# Patient Record
Sex: Male | Born: 1980 | Race: White | Hispanic: No | Marital: Single | Smoking: Current every day smoker
Health system: Southern US, Community
[De-identification: ages and names within clinical notes are randomized; demographics above are authoritative.]

---
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
## 2005-02-03 ENCOUNTER — Emergency Department: Payer: Self-pay | Admitting: General Practice

## 2006-08-07 ENCOUNTER — Emergency Department: Payer: Self-pay | Admitting: Emergency Medicine

## 2006-08-29 ENCOUNTER — Emergency Department: Payer: Self-pay | Admitting: Emergency Medicine

## 2006-12-11 ENCOUNTER — Emergency Department: Payer: Self-pay | Admitting: Emergency Medicine

## 2007-09-05 ENCOUNTER — Emergency Department: Payer: Self-pay | Admitting: Emergency Medicine

## 2008-09-27 ENCOUNTER — Emergency Department: Payer: Self-pay | Admitting: Emergency Medicine

## 2010-11-22 ENCOUNTER — Emergency Department: Payer: Self-pay | Admitting: Unknown Physician Specialty

## 2012-11-24 ENCOUNTER — Emergency Department: Payer: Self-pay | Admitting: Emergency Medicine

## 2012-11-24 LAB — COMPREHENSIVE METABOLIC PANEL
Albumin: 4.1 g/dL (ref 3.4–5.0)
Alkaline Phosphatase: 95 U/L (ref 50–136)
BUN: 9 mg/dL (ref 7–18)
Bilirubin,Total: 0.4 mg/dL (ref 0.2–1.0)
Calcium, Total: 8.8 mg/dL (ref 8.5–10.1)
Chloride: 107 mmol/L (ref 98–107)
Co2: 26 mmol/L (ref 21–32)
Creatinine: 1.26 mg/dL (ref 0.60–1.30)
EGFR (Non-African Amer.): >60
Glucose: 148 mg/dL — ABNORMAL HIGH (ref 65–99)
Osmolality: 281 (ref 275–301)
Potassium: 3.9 mmol/L (ref 3.5–5.1)
SGPT (ALT): 29 U/L (ref 12–78)
Sodium: 140 mmol/L (ref 136–145)
Total Protein: 8 g/dL (ref 6.4–8.2)

## 2012-11-24 LAB — ETHANOL
Ethanol %: 0.177 % — ABNORMAL HIGH (ref 0.000–0.080)
Ethanol: 177 mg/dL

## 2012-11-24 LAB — DRUG SCREEN, URINE
Barbiturates, Ur Screen: NEGATIVE (ref ?–200)
Benzodiazepine, Ur Scrn: NEGATIVE (ref ?–200)
Cannabinoid 50 Ng, Ur ~~LOC~~: POSITIVE (ref ?–50)
Cocaine Metabolite,Ur ~~LOC~~: POSITIVE (ref ?–300)
Methadone, Ur Screen: NEGATIVE (ref ?–300)
Opiate, Ur Screen: NEGATIVE (ref ?–300)
Phencyclidine (PCP) Ur S: NEGATIVE (ref ?–25)

## 2012-11-24 LAB — URINALYSIS, COMPLETE
Bacteria: NONE SEEN
Leukocyte Esterase: NEGATIVE
Nitrite: NEGATIVE
Ph: 5 (ref 4.5–8.0)
Protein: NEGATIVE

## 2012-11-24 LAB — CBC
HCT: 45.6 % (ref 40.0–52.0)
HGB: 15.8 g/dL (ref 13.0–18.0)
MCH: 33.3 pg (ref 26.0–34.0)
Platelet: 271 10*3/uL (ref 150–440)
WBC: 12.2 10*3/uL — ABNORMAL HIGH (ref 3.8–10.6)

## 2014-03-09 ENCOUNTER — Emergency Department: Payer: Self-pay | Admitting: Emergency Medicine

## 2014-12-18 ENCOUNTER — Emergency Department: Payer: No Typology Code available for payment source

## 2014-12-18 ENCOUNTER — Emergency Department
Admission: EM | Admit: 2014-12-18 | Discharge: 2014-12-18 | Disposition: A | Discharge status: Home / Self Care | Payer: No Typology Code available for payment source | Attending: Emergency Medicine | Admitting: Emergency Medicine

## 2014-12-18 DIAGNOSIS — Y9389 Activity, other specified: Secondary | ICD-10-CM | POA: Diagnosis not present

## 2014-12-18 DIAGNOSIS — Y998 Other external cause status: Secondary | ICD-10-CM | POA: Insufficient documentation

## 2014-12-18 DIAGNOSIS — S20212A Contusion of left front wall of thorax, initial encounter: Secondary | ICD-10-CM | POA: Insufficient documentation

## 2014-12-18 DIAGNOSIS — Y92410 Unspecified street and highway as the place of occurrence of the external cause: Secondary | ICD-10-CM | POA: Diagnosis not present

## 2014-12-18 DIAGNOSIS — Z720 Tobacco use: Secondary | ICD-10-CM | POA: Insufficient documentation

## 2014-12-18 DIAGNOSIS — S299XXA Unspecified injury of thorax, initial encounter: Secondary | ICD-10-CM | POA: Diagnosis present

## 2014-12-18 MED ORDER — METHOCARBAMOL 500 MG PO TABS
1000.0000 mg | ORAL_TABLET | Freq: Once | ORAL | Status: AC
  Administered 2014-12-18: 1000 mg via ORAL
  Filled 2014-12-18: qty 2

## 2014-12-18 MED ORDER — OXYCODONE-ACETAMINOPHEN 5-325 MG PO TABS: 1.0000 | ORAL_TABLET | Freq: Four times a day (QID) | ORAL | Status: DC | PRN

## 2014-12-18 MED ORDER — TRAMADOL HCL 50 MG PO TABS
50.0000 mg | ORAL_TABLET | Freq: Once | ORAL | Status: AC
Start: 2014-12-18 — End: 2014-12-18
  Administered 2014-12-18: 50 mg via ORAL
  Filled 2014-12-18: qty 1

## 2014-12-18 MED ORDER — METHOCARBAMOL 750 MG PO TABS
1500.0000 mg | ORAL_TABLET | Freq: Four times a day (QID) | ORAL | Status: DC
Start: 2014-12-18 — End: 2023-08-18
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
## 2014-12-18 NOTE — ED Notes (Signed)
AAOx3.  Skin arm and dry.  NAD.

## 2014-12-18 NOTE — ED Notes (Signed)
Pt alert and oriented X4, active, cooperative, pt in NAD. RR even and unlabored, color WNL.  Pt informed to return if any life threatening symptoms occur.   
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
## 2014-12-18 NOTE — Discharge Instructions (Signed)

## 2015-06-07 ENCOUNTER — Emergency Department: Payer: Self-pay

## 2015-06-07 ENCOUNTER — Encounter: Payer: Self-pay | Admitting: Urgent Care

## 2015-06-07 ENCOUNTER — Emergency Department
Admission: EM | Admit: 2015-06-07 | Discharge: 2015-06-07 | Disposition: A | Discharge status: Home / Self Care | Payer: Self-pay | Attending: Emergency Medicine | Admitting: Emergency Medicine

## 2015-06-07 DIAGNOSIS — S20229A Contusion of unspecified back wall of thorax, initial encounter: Secondary | ICD-10-CM | POA: Insufficient documentation

## 2015-06-07 DIAGNOSIS — W000XXA Fall on same level due to ice and snow, initial encounter: Secondary | ICD-10-CM | POA: Insufficient documentation

## 2015-06-07 DIAGNOSIS — Y9389 Activity, other specified: Secondary | ICD-10-CM | POA: Insufficient documentation

## 2015-06-07 DIAGNOSIS — Z79899 Other long term (current) drug therapy: Secondary | ICD-10-CM | POA: Insufficient documentation

## 2015-06-07 DIAGNOSIS — F17200 Nicotine dependence, unspecified, uncomplicated: Secondary | ICD-10-CM | POA: Insufficient documentation

## 2015-06-07 DIAGNOSIS — G8929 Other chronic pain: Secondary | ICD-10-CM | POA: Insufficient documentation

## 2015-06-07 DIAGNOSIS — Y998 Other external cause status: Secondary | ICD-10-CM | POA: Insufficient documentation

## 2015-06-07 DIAGNOSIS — M545 Low back pain: Secondary | ICD-10-CM

## 2015-06-07 DIAGNOSIS — S300XXA Contusion of lower back and pelvis, initial encounter: Secondary | ICD-10-CM | POA: Insufficient documentation

## 2015-06-07 DIAGNOSIS — Y9289 Other specified places as the place of occurrence of the external cause: Secondary | ICD-10-CM | POA: Insufficient documentation

## 2015-06-07 MED ORDER — KETOROLAC TROMETHAMINE 60 MG/2ML IM SOLN
INTRAMUSCULAR | Status: AC
  Filled 2015-06-07: qty 2

## 2015-06-07 MED ORDER — CYCLOBENZAPRINE HCL 10 MG PO TABS
ORAL_TABLET | ORAL | Status: AC
  Administered 2015-06-07: 10 mg via ORAL
  Filled 2015-06-07: qty 1

## 2015-06-07 MED ORDER — CYCLOBENZAPRINE HCL 10 MG PO TABS: 10.0000 mg | ORAL_TABLET | Freq: Three times a day (TID) | ORAL | Status: DC | PRN

## 2015-06-07 MED ORDER — KETOROLAC TROMETHAMINE 30 MG/ML IJ SOLN
60.0000 mg | Freq: Once | INTRAMUSCULAR | Status: AC
  Administered 2015-06-07: 60 mg via INTRAMUSCULAR

## 2015-06-07 MED ORDER — CYCLOBENZAPRINE HCL 10 MG PO TABS
10.0000 mg | ORAL_TABLET | Freq: Once | ORAL | Status: AC
  Administered 2015-06-07: 10 mg via ORAL
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
## 2015-06-07 NOTE — ED Notes (Signed)
Received report from [place] F [id number], care assumed.  Pt resting in bed at this time, appears in nad.
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
## 2015-06-07 NOTE — Discharge Instructions (Signed)
Back Pain, Adult °Back pain is very common in adults. The cause of back pain is rarely dangerous and the pain often gets better over time. The cause of your back pain may not be known. Some common causes of back pain include: °· Strain of the muscles or ligaments supporting the spine. °· Wear and tear (degeneration) of the spinal disks. °· Arthritis. °· Direct injury to the back. °For many people, back pain may return. Since back pain is rarely dangerous, most people can learn to manage this condition on their own. °HOME CARE INSTRUCTIONS °Watch your back pain for any changes. The following actions may help to lessen any discomfort you are feeling: °· Remain active. It is stressful on your back to sit or stand in one place for long periods of time. Do not sit, drive, or stand in one place for more than 30 minutes at a time. Take short walks on even surfaces as soon as you are able. Try to increase the length of time you walk each day. °· Exercise regularly as directed by your health care provider. Exercise helps your back heal faster. It also helps avoid future injury by keeping your muscles strong and flexible. °· Do not stay in bed. Resting more than 1-2 days can delay your recovery. °· Pay attention to your body when you bend and lift. The most comfortable positions are those that put less stress on your recovering back. Always use proper lifting techniques, including: °¨ Bending your knees. °¨ Keeping the load close to your body. °¨ Avoiding twisting. °· Find a comfortable position to sleep. Use a firm mattress and lie on your side with your knees slightly bent. If you lie on your back, put a pillow under your knees. °· Avoid feeling anxious or stressed. Stress increases muscle tension and can worsen back pain. It is important to recognize when you are anxious or stressed and learn ways to manage it, such as with exercise. °· Take medicines only as directed by your health care provider. Over-the-counter  medicines to reduce pain and inflammation are often the most helpful. Your health care provider may prescribe muscle relaxant drugs. These medicines help dull your pain so you can more quickly return to your normal activities and healthy exercise. °· Apply ice to the injured area: °¨ Put ice in a plastic bag. °¨ Place a towel between your skin and the bag. °¨ Leave the ice on for 20 minutes, 2-3 times a day for the first 2-3 days. After that, ice and heat may be alternated to reduce pain and spasms. °· Maintain a healthy weight. Excess weight puts extra stress on your back and makes it difficult to maintain good posture. °SEEK MEDICAL CARE IF: °· You have pain that is not relieved with rest or medicine. °· You have increasing pain going down into the legs or buttocks. °· You have pain that does not improve in one week. °· You have night pain. °· You lose weight. °· You have a fever or chills. °SEEK IMMEDIATE MEDICAL CARE IF:  °· You develop new bowel or bladder control problems. °· You have unusual weakness or numbness in your arms or legs. °· You develop nausea or vomiting. °· You develop abdominal pain. °· You feel faint. °  °This information is not intended to replace advice given to you by your health care provider. Make sure you discuss any questions you have with your health care provider. °  °Document Released: 05/14/2005 Document Revised: 06/04/2014 Document Reviewed: 09/15/2013 °Elsevier Interactive Patient Education ©2016 [employer]   Inc. ° °Contusion °A contusion is a deep bruise. Contusions are the result of a blunt injury to tissues and muscle fibers under the skin. The injury causes bleeding under the skin. The skin overlying the contusion may turn blue, purple, or yellow. Minor injuries will give you a painless contusion, but more severe contusions may stay painful and swollen for a few weeks.  °CAUSES  °This condition is usually caused by a blow, trauma, or direct force to an area of the body. °SYMPTOMS   °Symptoms of this condition include: °· Swelling of the injured area. °· Pain and tenderness in the injured area. °· Discoloration. The area may have redness and then turn blue, purple, or yellow. °DIAGNOSIS  °This condition is diagnosed based on a physical exam and medical history. An X-ray, CT scan, or MRI may be needed to determine if there are any associated injuries, such as broken bones (fractures). °TREATMENT  °Specific treatment for this condition depends on what area of the body was injured. In general, the best treatment for a contusion is resting, icing, applying pressure to (compression), and elevating the injured area. This is often called the RICE strategy. Over-the-counter anti-inflammatory medicines may also be recommended for pain control.  °HOME CARE INSTRUCTIONS  °· Rest the injured area. °· If directed, apply ice to the injured area: °¨ Put ice in a plastic bag. °¨ Place a towel between your skin and the bag. °¨ Leave the ice on for 20 minutes, 2-3 times per day. °· If directed, apply light compression to the injured area using an elastic bandage. Make sure the bandage is not wrapped too tightly. Remove and reapply the bandage as directed by your health care provider. °· If possible, raise (elevate) the injured area above the level of your heart while you are sitting or lying down. °· Take over-the-counter and prescription medicines only as told by your health care provider. °SEEK MEDICAL CARE IF: °· Your symptoms do not improve after several days of treatment. °· Your symptoms get worse. °· You have difficulty moving the injured area. °SEEK IMMEDIATE MEDICAL CARE IF:  °· You have severe pain. °· You have numbness in a hand or foot. °· Your hand or foot turns pale or cold. °  °This information is not intended to replace advice given to you by your health care provider. Make sure you discuss any questions you have with your health care provider. °  °Document Released: 02/21/2005 Document Revised:  02/02/2015 Document Reviewed: 09/29/2014 °Elsevier Interactive Patient Education ©2016 Elsevier Inc. ° °

## 2015-06-07 NOTE — ED Notes (Signed)
Pt reports lower mid back pain due to a fall. Pt denies bowel or bladder incontinence.

## 2016-01-04 ENCOUNTER — Encounter: Payer: Self-pay | Admitting: Emergency Medicine

## 2016-01-04 ENCOUNTER — Emergency Department
Admission: EM | Admit: 2016-01-04 | Discharge: 2016-01-04 | Disposition: A | Discharge status: Home / Self Care | Payer: Self-pay | Attending: Emergency Medicine | Admitting: Emergency Medicine

## 2016-01-04 DIAGNOSIS — K0889 Other specified disorders of teeth and supporting structures: Secondary | ICD-10-CM | POA: Insufficient documentation

## 2016-01-04 DIAGNOSIS — F17200 Nicotine dependence, unspecified, uncomplicated: Secondary | ICD-10-CM | POA: Insufficient documentation

## 2016-01-04 DIAGNOSIS — Z5321 Procedure and treatment not carried out due to patient leaving prior to being seen by health care provider: Secondary | ICD-10-CM | POA: Insufficient documentation

## 2016-01-04 NOTE — ED Notes (Signed)
Pt has not reentered ED lobby

## 2016-01-04 NOTE — ED Notes (Addendum)
Pt noted leaving lobby with SO

## 2016-01-04 NOTE — ED Notes (Signed)
Pt not found in lobby or outside  

## 2016-01-04 NOTE — ED Triage Notes (Signed)
Patient ambulatory to triage with steady gait, without difficulty or distress noted; pt reports "abscess tooth" x 4-5 days ago, with dental pain several months
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
## 2016-10-22 DIAGNOSIS — Z5321 Procedure and treatment not carried out due to patient leaving prior to being seen by health care provider: Secondary | ICD-10-CM | POA: Insufficient documentation

## 2016-10-22 DIAGNOSIS — R0781 Pleurodynia: Secondary | ICD-10-CM | POA: Insufficient documentation

## 2016-10-22 DIAGNOSIS — F17200 Nicotine dependence, unspecified, uncomplicated: Secondary | ICD-10-CM | POA: Insufficient documentation

## 2016-10-22 NOTE — ED Triage Notes (Signed)
Pt presents to ED with left sided lower rib pain. Pt states it hurts worse when taking a deep breath or when lying down. Pain improves when standing. Pt currently has no increased work of breathing or distress noted. Pt states  "I would like to check my heart rate and if it isn't heart attack level I need to go to work."

## 2016-10-23 ENCOUNTER — Emergency Department
Admission: EM | Admit: 2016-10-23 | Discharge: 2016-10-23 | Disposition: A | Discharge status: Home / Self Care | Payer: Self-pay | Attending: Dermatology | Admitting: Dermatology

## 2016-10-23 ENCOUNTER — Emergency Department: Payer: Self-pay

## 2016-10-23 NOTE — ED Notes (Signed)
Pt called to exam room. No answer. Pt seen leaving. Did not announce leaving to staff or STAT desk

## 2016-10-23 NOTE — ED Notes (Signed)
Pt refuses blood draw. MD notified and nurse made aware.

## 2017-11-08 ENCOUNTER — Other Ambulatory Visit: Payer: Self-pay

## 2017-11-08 ENCOUNTER — Encounter: Payer: Self-pay | Admitting: Emergency Medicine

## 2017-11-08 ENCOUNTER — Emergency Department
Admission: EM | Admit: 2017-11-08 | Discharge: 2017-11-08 | Disposition: A | Discharge status: Home / Self Care | Payer: Self-pay | Attending: Emergency Medicine | Admitting: Emergency Medicine

## 2017-11-08 DIAGNOSIS — Z79899 Other long term (current) drug therapy: Secondary | ICD-10-CM | POA: Insufficient documentation

## 2017-11-08 DIAGNOSIS — F17210 Nicotine dependence, cigarettes, uncomplicated: Secondary | ICD-10-CM | POA: Insufficient documentation

## 2017-11-08 DIAGNOSIS — B86 Scabies: Secondary | ICD-10-CM | POA: Insufficient documentation

## 2017-11-08 MED ORDER — PERMETHRIN 5 % EX CREA
TOPICAL_CREAM | Freq: Once | CUTANEOUS | Status: AC
  Administered 2017-11-08: 06:00:00 via TOPICAL
  Filled 2017-11-08 (×2): qty 60

## 2017-11-08 NOTE — ED Provider Notes (Signed)
[facility] Emergency Department Provider Note  I have reviewed the triage vital signs and the nursing notes.   HISTORY  Chief Complaint Pruritis   HPI Cody MoccasinDanny E Throckmorton Jr. is a 37 y.o. male presents to the emergency department with generalized pruritic rash.  Patient states his friend who lives with currently has the same and was treated for scabies.  Past medical history Noncontributory There are no active problems to display for this patient.   Past surgical history Noncontributory  Prior to Admission medications   Medication Sig Start Date End Date Taking? Authorizing Provider  cyclobenzaprine (FLEXERIL) 10 MG tablet Take 1 tablet (10 mg total) by mouth 3 (three) times daily as needed for muscle spasms. 06/07/15   Darci CurrentBrown, Malakoff N, MD  methocarbamol (ROBAXIN-750) 750 MG tablet Take 2 tablets (1,500 mg total) by mouth 4 (four) times daily. 12/18/14   Joni ReiningSmith, Ronald K, PA-C  oxyCODONE-acetaminophen (ROXICET) 5-325 MG per tablet Take 1 tablet by mouth every 6 (six) hours as needed for severe pain. 12/18/14   Joni ReiningSmith, Ronald K, PA-C    Allergies No known drug allergies No family history on file.  Social History Social History   Tobacco Use  . Smoking status: Current Every Day Smoker    Types: Cigarettes  . Smokeless tobacco: Never Used  Substance Use Topics  . Alcohol use: Yes  . Drug use: Not on file    Review of Systems Constitutional: No fever/chills Eyes: No visual changes. ENT: No sore throat. Cardiovascular: Denies chest pain. Respiratory: Denies shortness of breath. Gastrointestinal: No abdominal pain.  No nausea, no vomiting.  No diarrhea.  No constipation. Genitourinary: Negative for dysuria. Musculoskeletal: Negative for neck pain.  Negative for back pain. Integumentary: Positive for generalized pruritic rash Neurological: Negative for headaches, focal weakness or  numbness.   ____________________________________________   PHYSICAL EXAM:  VITAL SIGNS: ED Triage Vitals  Enc Vitals Group     BP 11/08/17 0401 128/84     Pulse Rate 11/08/17 0401 (!) 59     Resp 11/08/17 0401 18     Temp 11/08/17 0401 97.9 F (36.6 C)     Temp Source 11/08/17 0401 Oral     SpO2 11/08/17 0401 100 %     Weight 11/08/17 0402 76.2 kg (168 lb)     Height 11/08/17 0402 1.702 m (5\' 7" )     Head Circumference --      Peak Flow --      Pain Score 11/08/17 0402 0     Pain Loc --      Pain Edu? --      Excl. in GC? --     Constitutional: Alert and oriented. Well appearing and in no acute distress. Eyes: Conjunctivae are normal. Head: Atraumatic. Mouth/Throat: Mucous membranes are moist.  Neck: No stridor.   Musculoskeletal: No lower extremity tenderness nor edema. No gross deformities of extremities. Neurologic:  Normal speech and language. No gross focal neurologic deficits are appreciated.  Skin: Generalized rash consistent with scabies       Procedures   ____________________________________________   INITIAL IMPRESSION / ASSESSMENT AND PLAN / ED COURSE  As part of my medical decision making, I reviewed the following data within the electronic MEDICAL RECORD NUMBER   37 year old male presented with above-stated history and physical exam consistent with scabies.  Patient given permethrin in the emergency department.  Advised the patient of how to use the permethrin  ____________________________________________  FINAL CLINICAL IMPRESSION(S) / ED  DIAGNOSES  Final diagnoses:  Scabies     MEDICATIONS GIVEN DURING THIS VISIT:  Medications  permethrin (ELIMITE) 5 % cream (has no administration in time range)     ED Discharge Orders    None       Note:  This document was prepared using Dragon voice recognition software and may include unintentional dictation errors.    Darci Current, MD 11/08/17 [phone]

## 2017-11-08 NOTE — ED Triage Notes (Signed)
Pt presents to ED with itching all over today. Wife has been dx with scabies and has encouraged pt to check in to be tx as well.

## 2023-08-18 ENCOUNTER — Emergency Department
Admission: EM | Admit: 2023-08-18 | Discharge: 2023-08-18 | Disposition: A | Discharge status: Home / Self Care | Payer: Self-pay | Attending: Emergency Medicine | Admitting: Emergency Medicine

## 2023-08-18 ENCOUNTER — Other Ambulatory Visit: Payer: Self-pay

## 2023-08-18 DIAGNOSIS — K0889 Other specified disorders of teeth and supporting structures: Secondary | ICD-10-CM

## 2023-08-18 DIAGNOSIS — K029 Dental caries, unspecified: Secondary | ICD-10-CM | POA: Insufficient documentation

## 2023-08-18 MED ORDER — TRAMADOL HCL 50 MG PO TABS: 50.0000 mg | ORAL_TABLET | Freq: Four times a day (QID) | ORAL | 0 refills | Status: AC | PRN

## 2023-08-18 MED ORDER — NAPROXEN 500 MG PO TABS: 500.0000 mg | ORAL_TABLET | Freq: Two times a day (BID) | ORAL | 2 refills | Status: AC

## 2023-08-18 MED ORDER — AMOXICILLIN 500 MG PO CAPS: 500.0000 mg | ORAL_CAPSULE | Freq: Three times a day (TID) | ORAL | 0 refills | Status: AC

## 2023-08-18 NOTE — ED Notes (Signed)
 See triage note  Presents with dental pain  Having pain to right upper gumline  States his teeth are bad  No swelling noted

## 2023-08-18 NOTE — ED Triage Notes (Signed)
 Upper right dental pain for the last week that has been getting worse.

## 2023-08-18 NOTE — ED Provider Notes (Signed)
 [facility] Provider Note    Event Date/Time   First MD Initiated Contact with Patient 08/18/23 [phone]     (approximate)   History   Dental Pain   HPI  Cody Dominguez. is a 43 y.o. male presents emergency department, no significant past medical history, complaining of right upper tooth pain.  Patient states he has a lot of dental problems.  Pain got so bad he is not been able to sleep.  Denies fever or chills.  No chest pain shortness of breath.      Physical Exam   Triage Vital Signs: ED Triage Vitals  Encounter Vitals Group     BP 08/18/23 0753 (!) 144/105     Systolic BP Percentile --      Diastolic BP Percentile --      Pulse Rate 08/18/23 0753 (!) 110     Resp 08/18/23 0753 18     Temp 08/18/23 0753 98.2 F (36.8 C)     Temp Source 08/18/23 0753 Oral     SpO2 08/18/23 0753 99 %     Weight 08/18/23 0754 165 lb (74.8 kg)     Height 08/18/23 0754 5\' 8"  (1.727 m)     Head Circumference --      Peak Flow --      Pain Score 08/18/23 0754 8     Pain Loc --      Pain Education --      Exclude from Growth Chart --     Most recent vital signs: Vitals:   08/18/23 0753  BP: (!) 144/105  Pulse: (!) 110  Resp: 18  Temp: 98.2 F (36.8 C)  SpO2: 99%     General: Awake, no distress.   CV:  Good peripheral perfusion. regular rate and  rhythm Resp:  Normal effort.  Abd:  No distention.   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                              Differential diagnosis includes, but is not limited to, dental pain, dental caries, dental abscess  Patient's presentation is most consistent with acute illness / injury with system symptoms.   I did explain findings to patient.  Instructed him to follow-up with the dental clinic.  Was given a list of discounted dental clinics.  Prescription for amoxicillin, Naprosyn, and tramadol.  Instructed him to not use alcohol while using narcotics.  Patient is in agreement treatment plan, discharged stable condition.      FINAL CLINICAL IMPRESSION(S) / ED DIAGNOSES   Final diagnoses:  Pain, dental  Pain due to dental caries     Rx / DC Orders   ED Discharge Orders          Ordered    amoxicillin (AMOXIL) 500  MG capsule  3 times daily        08/18/23 0806    naproxen (NAPROSYN) 500 MG tablet  2 times daily with meals        08/18/23 0806    traMADol (ULTRAM) 50 MG tablet  Every 6 hours PRN        08/18/23 2841             Note:  This document was prepared using Dragon voice recognition software and may include unintentional dictation errors.    Faythe Ghee, PA-C 08/18/23 Alesia Richards    Dionne Bucy, MD 08/18/23 [phone]

## 2023-08-18 NOTE — Discharge Instructions (Signed)

## 2023-09-09 ENCOUNTER — Emergency Department
Admission: EM | Admit: 2023-09-09 | Discharge: 2023-09-09 | Disposition: A | Discharge status: Home / Self Care | Payer: Self-pay | Attending: Emergency Medicine | Admitting: Emergency Medicine

## 2023-09-09 ENCOUNTER — Other Ambulatory Visit: Payer: Self-pay

## 2023-09-09 ENCOUNTER — Emergency Department: Payer: Self-pay

## 2023-09-09 DIAGNOSIS — F419 Anxiety disorder, unspecified: Secondary | ICD-10-CM | POA: Insufficient documentation

## 2023-09-09 DIAGNOSIS — R079 Chest pain, unspecified: Secondary | ICD-10-CM | POA: Insufficient documentation

## 2023-09-09 DIAGNOSIS — R000 Tachycardia, unspecified: Secondary | ICD-10-CM | POA: Insufficient documentation

## 2023-09-09 LAB — CBC
HCT: 48.7 % (ref 39.0–52.0)
Hemoglobin: 16.9 g/dL (ref 13.0–17.0)
MCH: 32.8 pg (ref 26.0–34.0)
MCHC: 34.7 g/dL (ref 30.0–36.0)
MCV: 94.6 fL (ref 80.0–100.0)
Platelets: 366 10*3/uL (ref 150–400)
RBC: 5.15 MIL/uL (ref 4.22–5.81)
RDW: 13.8 % (ref 11.5–15.5)
WBC: 12 10*3/uL — ABNORMAL HIGH (ref 4.0–10.5)
nRBC: 0.2 % (ref 0.0–0.2)

## 2023-09-09 LAB — BASIC METABOLIC PANEL WITH GFR
Anion gap: 13 (ref 5–15)
BUN: 9 mg/dL (ref 6–20)
CO2: 23 mmol/L (ref 22–32)
Calcium: 9.6 mg/dL (ref 8.9–10.3)
Chloride: 96 mmol/L — ABNORMAL LOW (ref 98–111)
Creatinine, Ser: 1.2 mg/dL (ref 0.61–1.24)
GFR, Estimated: >60 mL/min (ref >60)
Glucose, Bld: 86 mg/dL (ref 70–99)
Potassium: 4 mmol/L (ref 3.5–5.1)
Sodium: 132 mmol/L — ABNORMAL LOW (ref 135–145)

## 2023-09-09 LAB — TROPONIN I (HIGH SENSITIVITY)
Troponin I (High Sensitivity): 9 ng/L (ref <18)
Troponin I (High Sensitivity): 11 ng/L (ref <18)

## 2023-09-09 MED ORDER — HYDROXYZINE HCL 25 MG PO TABS
25.0000 mg | ORAL_TABLET | Freq: Once | ORAL | Status: AC
  Administered 2023-09-09: 25 mg via ORAL
  Filled 2023-09-09: qty 1

## 2023-09-09 NOTE — ED Notes (Signed)
 Pt declines IV at this time for second lab set if needed. This RN explained procedure to Pt and he stated that he didn't want an IV or to be stuck again no matter what. Urine cup given for sample. EDP notified.

## 2023-09-09 NOTE — Discharge Instructions (Signed)
Return to the ER for worsening symptoms or any other concerns

## 2023-09-09 NOTE — ED Notes (Signed)
 Pt declines second trop collection d/t fear of needles. EDP made aware.

## 2023-09-09 NOTE — ED Triage Notes (Addendum)
 Patient was walking when he had chest pain. EKG showed tachy at 130. Patient states he hasn't slept in 2 days or so. Patient has anxiety but doesn't take anxiety medications for it. Recently lost loved one. Patient denies SI. Patient ambulatory in ED.

## 2023-09-09 NOTE — ED Provider Notes (Signed)
 [facility] Provider Note    Event Date/Time   First MD Initiated Contact with Patient 09/09/23 [phone]     (approximate)   History   Anxiety   HPI  Cody Dominguez. is a 43 y.o. male who is not on any medications who comes in with concerns for chest pain.  Patient stated that he has been staying with a friend.  He states that he was walking to his mom's house who was then going to take him to his dad's house but while walking he stated that he got really anxious and started having some chest pain.  He reports that the symptoms have nearly all resolved now.  He reports recently losing a loved 1 but he denies any SI or HI.  He reports a history of anxiety but does not take anything to help with it.  Patient states that he does not able to afford anything.  He denies any shortness of breath, abdominal pain.  Does report occasional EtOH, drug use.  Physical Exam   Triage Vital Signs: ED Triage Vitals [09/09/23 0854]  Encounter Vitals Group     BP      Systolic BP Percentile      Diastolic BP Percentile      Pulse      Resp      Temp      Temp src      SpO2      Weight      Height      Head Circumference      Peak Flow      Pain Score 3     Pain Loc      Pain Education      Exclude from Growth Chart     Most recent vital signs: Vitals:   09/09/23 0909  BP: 116/86  Pulse: (!) 116  Resp: 20  Temp: 98.3 F (36.8 C)  SpO2: 100%     General: Awake, no distress.  CV:  Good peripheral perfusion.  Resp:  Normal effort.  Abd:  No distention.  Soft nontender Other:  No swelling in legs.  No calf tenderness   ED Results / Procedures / Treatments   Labs (all labs ordered are listed, but only abnormal results are displayed) Labs Reviewed  CBC - Abnormal; Notable for the following components:      Result Value   WBC 12.0 (*)    All other components within normal limits  BASIC METABOLIC PANEL WITH GFR  TROPONIN I (HIGH SENSITIVITY)      EKG  My interpretation of EKG:  Sinus tachycardia rate of 104 without any ST elevation or T wave inversions, normal intervals  RADIOLOGY I have reviewed the xray personally and interpreted no evidence of pneumonia   PROCEDURES:  Critical Care performed: No  .1-3 Lead EKG Interpretation  Performed by: Concha Se, MD Authorized by: Concha Se, MD     Interpretation: normal     ECG rate:  90   ECG rate assessment: normal     Rhythm: sinus rhythm     Ectopy: none     Conduction: normal      MEDICATIONS ORDERED IN ED: Medications  hydrOXYzine (ATARAX) tablet 25 mg (25 mg Oral Given 09/09/23 1004)     IMPRESSION / MDM / ASSESSMENT AND PLAN / ED COURSE  I reviewed the triage vital signs and the nursing notes.   Patient's presentation is most consistent with acute presentation  with potential threat to life or bodily function.   Patient comes in with concerns for having a panic attack.  He reports feeling anxious however discussed with patient that we need to make sure that this was not a heart attack and will get EKG, cardiac markers, chest x-ray to evaluate his lungs.  Patient denies any SI or HI.  We discussed whether or not he would want psychiatric evaluation but patient declined stated he would rather follow-up at [facility].  There is no indication for IVC at this time  Patient is slightly tachycardic I suspect more likely from anxiety.  10:00 AM reevaluated patient and he denies any risk factors for PE.  He has no evidence of DVT.  He denies any chest pain or shortness of breath currently.  He does report testosterone use but it has been over months ago.  I suspect that this is still related to anxiety and we will continue to closely monitor.  Will give a dose of hydroxyzine  10:49 AM patient reports feeling better.  He does report a little bit of fatigue from the medications.  Will get repeat troponin and if negative patient can be discharged home.  11:44 AM patient  has had resolution of symptoms resting comfortably in bed.  He denies any concerns at this time.  He states that he can call somebody to come help pick him up.  Troponins are negative x 2.  Suspect this is more likely related to anxiety given he does report a history of this and he will follow-up with [facility] he declined need to see psychiatry today and denies any SI  The patient is on the cardiac monitor to evaluate for evidence of arrhythmia and/or significant heart rate changes.      FINAL CLINICAL IMPRESSION(S) / ED DIAGNOSES   Final diagnoses:  Anxiety  Chest pain, unspecified type     Rx / DC Orders   ED Discharge Orders     None        Note:  This document was prepared using Dragon voice recognition software and may include unintentional dictation errors.   Lubertha Rush, MD 09/09/23 1145

## 2023-12-24 ENCOUNTER — Emergency Department
Admission: EM | Admit: 2023-12-24 | Discharge: 2023-12-24 | Disposition: A | Discharge status: Home / Self Care | Payer: Self-pay | Attending: Emergency Medicine | Admitting: Emergency Medicine

## 2023-12-24 ENCOUNTER — Emergency Department: Payer: Self-pay

## 2023-12-24 ENCOUNTER — Encounter: Payer: Self-pay | Admitting: Emergency Medicine

## 2023-12-24 ENCOUNTER — Other Ambulatory Visit: Payer: Self-pay

## 2023-12-24 DIAGNOSIS — S0012XA Contusion of left eyelid and periocular area, initial encounter: Secondary | ICD-10-CM | POA: Insufficient documentation

## 2023-12-24 DIAGNOSIS — S0990XA Unspecified injury of head, initial encounter: Secondary | ICD-10-CM | POA: Insufficient documentation

## 2023-12-24 DIAGNOSIS — Z23 Encounter for immunization: Secondary | ICD-10-CM | POA: Insufficient documentation

## 2023-12-24 DIAGNOSIS — M542 Cervicalgia: Secondary | ICD-10-CM | POA: Insufficient documentation

## 2023-12-24 MED ORDER — TETANUS-DIPHTH-ACELL PERTUSSIS 5-2.5-18.5 LF-MCG/0.5 IM SUSY
0.5000 mL | PREFILLED_SYRINGE | Freq: Once | INTRAMUSCULAR | Status: AC
  Administered 2023-12-24: 0.5 mL via INTRAMUSCULAR
  Filled 2023-12-24: qty 0.5

## 2023-12-24 MED ORDER — HYDROCODONE-ACETAMINOPHEN 5-325 MG PO TABS
1.0000 | ORAL_TABLET | Freq: Once | ORAL | Status: AC
  Administered 2023-12-24: 1 via ORAL
  Filled 2023-12-24: qty 1

## 2023-12-24 MED ORDER — ONDANSETRON 4 MG PO TBDP
4.0000 mg | ORAL_TABLET | Freq: Once | ORAL | Status: AC
  Administered 2023-12-24: 4 mg via ORAL
  Filled 2023-12-24: qty 1
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
## 2023-12-24 NOTE — Discharge Instructions (Signed)
 You may alternate over the counter Tylenol 1000 mg every 6 hours as needed for pain, fever and Ibuprofen 800 mg every 6-8 hours as needed for pain, fever.  Please take Ibuprofen with food.  Do not take more than 4000 mg of Tylenol (acetaminophen) in a 24 hour period.

## 2023-12-24 NOTE — ED Notes (Signed)
 Patient transported to CT
